# Patient Record
Sex: Male | Born: 1995 | Race: White | Hispanic: Yes | Marital: Single | Smoking: Never smoker
Health system: Southern US, Community
[De-identification: ages and names within clinical notes are randomized; demographics above are authoritative.]

---

## 2015-01-29 ENCOUNTER — Encounter (HOSPITAL_BASED_OUTPATIENT_CLINIC_OR_DEPARTMENT_OTHER): Payer: Self-pay

## 2015-01-29 ENCOUNTER — Emergency Department (HOSPITAL_BASED_OUTPATIENT_CLINIC_OR_DEPARTMENT_OTHER): Payer: Self-pay

## 2015-01-29 ENCOUNTER — Emergency Department (HOSPITAL_BASED_OUTPATIENT_CLINIC_OR_DEPARTMENT_OTHER)
Admission: EM | Admit: 2015-01-29 | Discharge: 2015-01-29 | Disposition: A | Discharge status: Home / Self Care | Payer: Self-pay | Attending: Emergency Medicine | Admitting: Emergency Medicine

## 2015-01-29 DIAGNOSIS — S61210A Laceration without foreign body of right index finger without damage to nail, initial encounter: Secondary | ICD-10-CM | POA: Insufficient documentation

## 2015-01-29 DIAGNOSIS — Y9389 Activity, other specified: Secondary | ICD-10-CM | POA: Insufficient documentation

## 2015-01-29 DIAGNOSIS — Y9289 Other specified places as the place of occurrence of the external cause: Secondary | ICD-10-CM | POA: Insufficient documentation

## 2015-01-29 DIAGNOSIS — W228XXA Striking against or struck by other objects, initial encounter: Secondary | ICD-10-CM | POA: Insufficient documentation

## 2015-01-29 DIAGNOSIS — S61219A Laceration without foreign body of unspecified finger without damage to nail, initial encounter: Secondary | ICD-10-CM

## 2015-01-29 DIAGNOSIS — Y998 Other external cause status: Secondary | ICD-10-CM | POA: Insufficient documentation

## 2015-01-29 MED ORDER — LIDOCAINE HCL (PF) 1 % IJ SOLN
INTRAMUSCULAR | Status: AC
  Administered 2015-01-29: 5 mL
  Filled 2015-01-29: qty 5

## 2015-01-29 MED ORDER — CEPHALEXIN 500 MG PO CAPS: 500.0000 mg | ORAL_CAPSULE | Freq: Three times a day (TID) | ORAL | Status: AC

## 2015-01-29 MED ORDER — BUPIVACAINE HCL 0.5 % IJ SOLN
INTRAMUSCULAR | Status: AC
  Administered 2015-01-29: 1 mL
  Filled 2015-01-29: qty 1

## 2015-01-29 NOTE — ED Notes (Signed)
Suture cart placed at bedside. 

## 2015-01-29 NOTE — ED Notes (Addendum)
Pt with limited english - reports laceration to right index finger on a tree. Bleeding controlled at this time.

## 2015-01-29 NOTE — ED Provider Notes (Signed)
CSN: [id number]     Arrival date & time 01/29/15  1404 History   First MD Initiated Contact with Patient 01/29/15 1417     Chief Complaint  Patient presents with  . Laceration     (Consider location/radiation/quality/duration/timing/severity/associated sxs/prior Treatment) Patient is a 19 y.o. male presenting with skin laceration. The history is provided by the patient. A language interpreter was used.  Laceration Location:  Finger Finger laceration location:  R index finger Length (cm):  2.5 Depth:  Through underlying tissue Quality: avulsion and jagged   Bleeding: controlled with pressure   Time since incident:  1 hour Injury mechanism: tree. Pain details:    Quality:  Throbbing   Severity:  Moderate   Timing:  Constant   Progression:  Worsening Foreign body present:  Unable to specify Relieved by:  None tried Worsened by:  Nothing tried Ineffective treatments:  None tried Tetanus status:  Up to date  Clayton Garcia is a 19 y.o. male who presents to the ED with a laceration to the right index finger. He states that he was cutting a tree and a piece of the tree was sticking out and he caught his finger on it.   History reviewed. No pertinent past medical history. History reviewed. No pertinent past surgical history. History reviewed. No pertinent family history. History  Substance Use Topics  . Smoking status: Never Smoker   . Smokeless tobacco: Not on file  . Alcohol Use: No    Review of Systems Negative except as stated in HPI   Allergies  Review of patient's allergies indicates not on file.  Home Medications   Prior to Admission medications   Medication Sig Start Date End Date Taking? Authorizing Provider  cephALEXin (KEFLEX) 500 MG capsule Take 1 capsule (500 mg total) by mouth 3 (three) times daily. 01/29/15   Clayton Mcgibbon Orlene Och, NP   BP 135/74 mmHg  Pulse 109  Temp(Src) 97.5 F (36.4 C)  Resp 18  Ht  (1.753 m)  Wt 130 lb (58.968 kg)  BMI 19.19  kg/m2  SpO2 100% Physical Exam  Constitutional: He is oriented to person, place, and time. He appears well-developed and well-nourished.  HENT:  Head: Normocephalic.  Eyes: Conjunctivae and EOM are normal.  Neck: Normal range of motion. Neck supple.  Cardiovascular: Normal rate.   Pulmonary/Chest: Effort normal.  Musculoskeletal: Normal range of motion.       Right hand: He exhibits tenderness and laceration. He exhibits normal range of motion and normal capillary refill. Normal sensation noted. Normal strength noted.       Hands: There is a deep, irregular flap laceration on the palmar aspect that extends to the dorsum of the finger. Good strength and sensation. No foreign body identified on x-ray or on physical exam. No tendon involvement identified.   Neurological: He is alert and oriented to person, place, and time. No cranial nerve deficit.  Skin: Skin is warm and dry.  Psychiatric: He has a normal mood and affect. His behavior is normal.  Nursing note and vitals reviewed.   ED Course  Procedures (including critical care time) LACERATION REPAIR Performed by: Clayton Garcia Authorized by: Clayton Garcia Consent: Verbal consent obtained. Risks and benefits: risks, benefits and alternatives were discussed Consent given by: patient Patient identity confirmed: provided demographic data Prepped and Draped in normal sterile fashion Wound explored  Laceration Location: right index finger  Flap laceration  Laceration Length: 2.5 cm  No Foreign Bodies seen or palpated  Digital Block  Local anesthetic: lidocaine 2% without epinephrine and Sensorcaine 0.5%  Anesthetic total: 5  ml  Irrigation method: syringe Amount of cleaning: standard  Skin closure: 4-0 prolene  Number of sutures: 5  Technique: interrupted   Patient tolerance: Patient tolerated the procedure well with no immediate complications.  Labs Review Labs Reviewed - No data to display  Imaging Review Dg  Finger Index Right  01/29/2015   CLINICAL DATA:  Laceration  EXAM: RIGHT INDEX FINGER 2+V  COMPARISON:  None.  FINDINGS: There is no evidence of fracture or dislocation. There is no evidence of arthropathy or other focal bone abnormality. Soft tissues are unremarkable.  IMPRESSION: Negative.   Electronically Signed   By: Marlan Palauharles  Clark M.D.   On: 01/29/2015 14:46   MDM  19 y.o. male with laceration to the right index finger while cutting a tree. Stable for d/c without neurovascular deficits. He will follow up in one week for suture removal or sooner for any problems.  Discussed with the patient and all questioned fully answered.  Final diagnoses:  Laceration of finger, initial encounter        [facility] Karina Nofsinger, NP 01/29/15 1617  Vanetta MuldersScott Zackowski, MD 01/30/15 [phone]

## 2015-01-29 NOTE — ED Notes (Signed)
MD at bedside. 

## 2015-01-29 NOTE — ED Notes (Signed)
Mom states td is utd.

## 2015-01-29 NOTE — Discharge Instructions (Signed)
Take tylenol or ibuprofen as needed for pain. °

## 2015-08-22 IMAGING — DX DG FINGER INDEX 2+V*R*
3 series · 3 of 3 positions shown · non-contrast
Comparison: None.

CLINICAL DATA: Laceration

EXAM:
RIGHT INDEX FINGER 2+V

[finger ap]
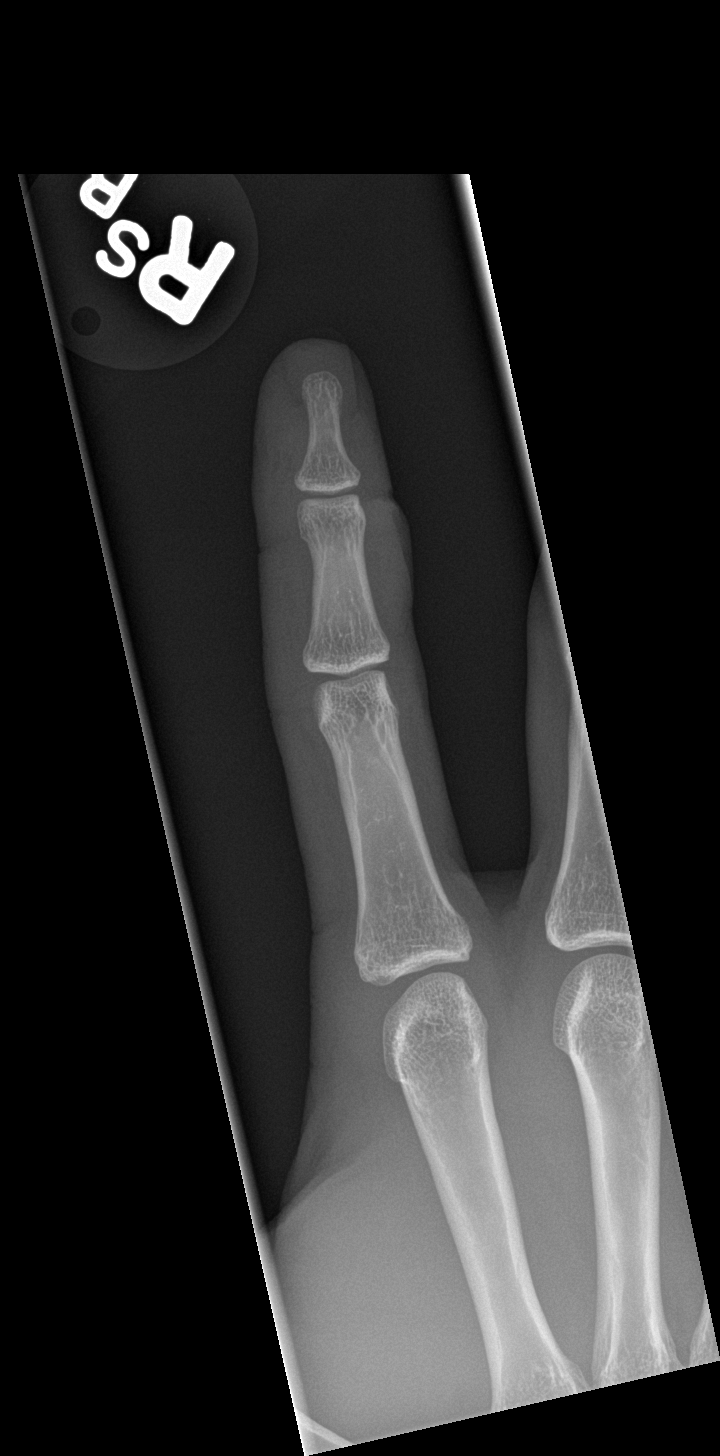

[finger obl]
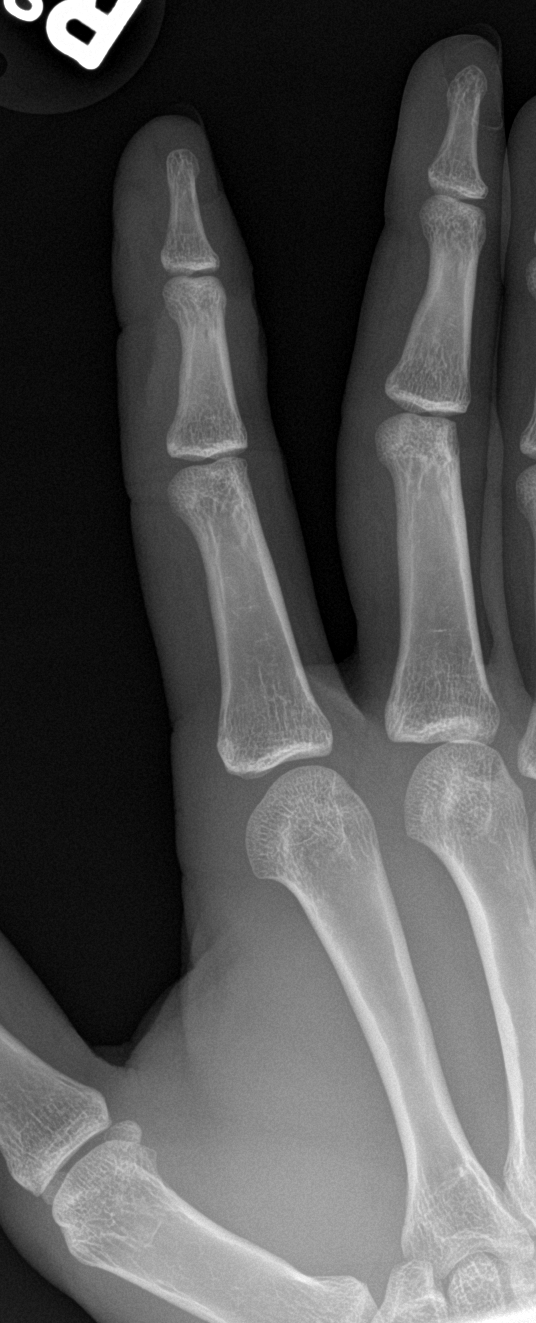

[finger lat]
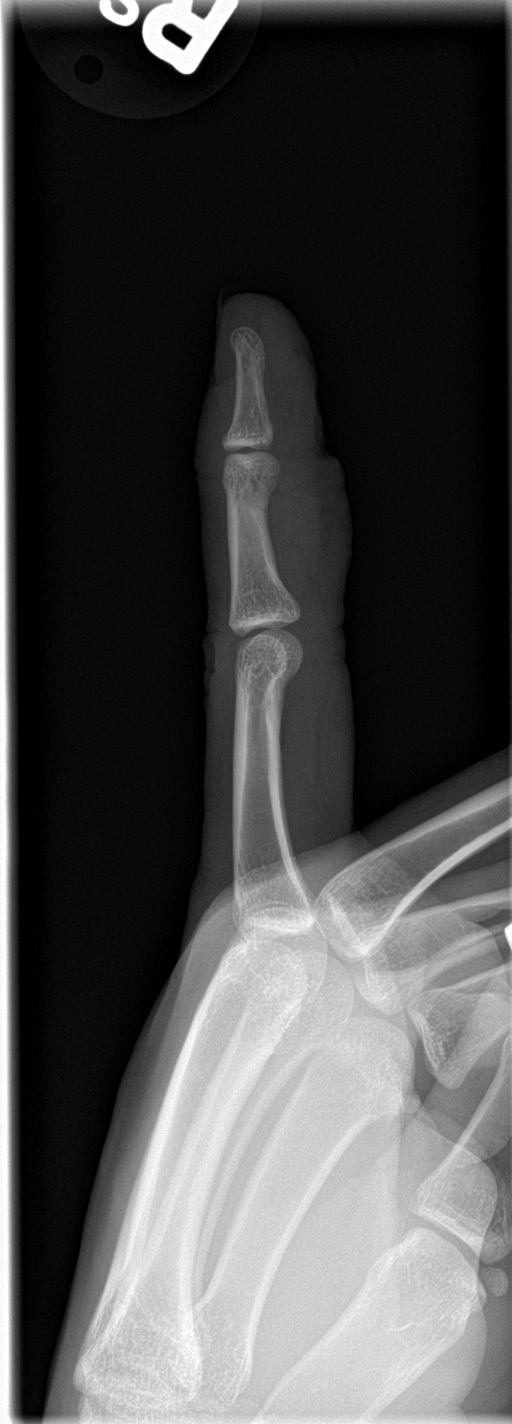

[3 of 3 positions shown; findings below may reference images not displayed]

FINDINGS: There is no evidence of fracture or dislocation. There is no
evidence of arthropathy or other focal bone abnormality. Soft
tissues are unremarkable.
IMPRESSION: Negative.

## 2024-03-26 ENCOUNTER — Other Ambulatory Visit: Payer: Self-pay

## 2024-03-26 ENCOUNTER — Emergency Department (HOSPITAL_BASED_OUTPATIENT_CLINIC_OR_DEPARTMENT_OTHER)

## 2024-03-26 ENCOUNTER — Emergency Department (HOSPITAL_BASED_OUTPATIENT_CLINIC_OR_DEPARTMENT_OTHER)
Admission: EM | Admit: 2024-03-26 | Discharge: 2024-03-26 | Disposition: A | Discharge status: Home / Self Care | Attending: Emergency Medicine | Admitting: Emergency Medicine

## 2024-03-26 ENCOUNTER — Ambulatory Visit: Admission: EM | Admit: 2024-03-26 | Discharge: 2024-03-26 | Disposition: A | Discharge status: Home / Self Care

## 2024-03-26 ENCOUNTER — Encounter (HOSPITAL_BASED_OUTPATIENT_CLINIC_OR_DEPARTMENT_OTHER): Payer: Self-pay | Admitting: Emergency Medicine

## 2024-03-26 DIAGNOSIS — R509 Fever, unspecified: Secondary | ICD-10-CM | POA: Diagnosis not present

## 2024-03-26 DIAGNOSIS — R197 Diarrhea, unspecified: Secondary | ICD-10-CM | POA: Insufficient documentation

## 2024-03-26 DIAGNOSIS — R112 Nausea with vomiting, unspecified: Secondary | ICD-10-CM | POA: Insufficient documentation

## 2024-03-26 DIAGNOSIS — R519 Headache, unspecified: Secondary | ICD-10-CM

## 2024-03-26 DIAGNOSIS — D72829 Elevated white blood cell count, unspecified: Secondary | ICD-10-CM | POA: Diagnosis not present

## 2024-03-26 DIAGNOSIS — R1031 Right lower quadrant pain: Secondary | ICD-10-CM

## 2024-03-26 LAB — CBC WITH DIFFERENTIAL/PLATELET
Abs Immature Granulocytes: 0.04 10*3/uL (ref 0.00–0.07)
Basophils Absolute: 0 10*3/uL (ref 0.0–0.1)
Basophils Relative: 0 %
Eosinophils Absolute: 0.1 10*3/uL (ref 0.0–0.5)
Eosinophils Relative: 0 %
HCT: 43.2 % (ref 39.0–52.0)
Hemoglobin: 14.8 g/dL (ref 13.0–17.0)
Immature Granulocytes: 0 %
Lymphocytes Relative: 15 %
Lymphs Abs: 1.8 10*3/uL (ref 0.7–4.0)
MCH: 29.8 pg (ref 26.0–34.0)
MCHC: 34.3 g/dL (ref 30.0–36.0)
MCV: 86.9 fL (ref 80.0–100.0)
Monocytes Absolute: 1.7 10*3/uL — ABNORMAL HIGH (ref 0.1–1.0)
Monocytes Relative: 14 %
Neutro Abs: 8.1 10*3/uL — ABNORMAL HIGH (ref 1.7–7.7)
Neutrophils Relative %: 71 %
Platelets: 251 10*3/uL (ref 150–400)
RBC: 4.97 MIL/uL (ref 4.22–5.81)
RDW: 13 % (ref 11.5–15.5)
WBC: 11.7 10*3/uL — ABNORMAL HIGH (ref 4.0–10.5)
nRBC: 0 % (ref 0.0–0.2)

## 2024-03-26 LAB — URINALYSIS, ROUTINE W REFLEX MICROSCOPIC
Bilirubin Urine: NEGATIVE
Glucose, UA: NEGATIVE mg/dL
Hgb urine dipstick: NEGATIVE
Ketones, ur: NEGATIVE mg/dL
Leukocytes,Ua: NEGATIVE
Nitrite: NEGATIVE
Protein, ur: NEGATIVE mg/dL
Specific Gravity, Urine: 1.015 (ref 1.005–1.030)
pH: 7 (ref 5.0–8.0)

## 2024-03-26 LAB — COMPREHENSIVE METABOLIC PANEL WITH GFR
ALT: 33 U/L (ref 0–44)
AST: 22 U/L (ref 15–41)
Albumin: 4.4 g/dL (ref 3.5–5.0)
Alkaline Phosphatase: 108 U/L (ref 38–126)
Anion gap: 14 (ref 5–15)
BUN: 8 mg/dL (ref 6–20)
CO2: 23 mmol/L (ref 22–32)
Calcium: 9.6 mg/dL (ref 8.9–10.3)
Chloride: 100 mmol/L (ref 98–111)
Creatinine, Ser: 1.06 mg/dL (ref 0.61–1.24)
GFR, Estimated: >60 mL/min (ref >60)
Glucose, Bld: 103 mg/dL — ABNORMAL HIGH (ref 70–99)
Potassium: 3.7 mmol/L (ref 3.5–5.1)
Sodium: 136 mmol/L (ref 135–145)
Total Bilirubin: 0.3 mg/dL (ref 0.0–1.2)
Total Protein: 7.9 g/dL (ref 6.5–8.1)

## 2024-03-26 LAB — LIPASE, BLOOD: Lipase: 27 U/L (ref 11–51)

## 2024-03-26 MED ORDER — ONDANSETRON 4 MG PO TBDP: 4.0000 mg | ORAL_TABLET | Freq: Three times a day (TID) | ORAL | 0 refills | Status: AC | PRN

## 2024-03-26 MED ORDER — MORPHINE SULFATE (PF) 4 MG/ML IV SOLN
4.0000 mg | Freq: Once | INTRAVENOUS | Status: AC
  Administered 2024-03-26: 4 mg via INTRAVENOUS
  Filled 2024-03-26: qty 1

## 2024-03-26 MED ORDER — IOHEXOL 300 MG/ML  SOLN
100.0000 mL | Freq: Once | INTRAMUSCULAR | Status: AC | PRN
  Administered 2024-03-26: 100 mL via INTRAVENOUS

## 2024-03-26 MED ORDER — ONDANSETRON HCL 4 MG/2ML IJ SOLN
4.0000 mg | Freq: Once | INTRAMUSCULAR | Status: AC
  Administered 2024-03-26: 4 mg via INTRAVENOUS
  Filled 2024-03-26: qty 2

## 2024-03-26 NOTE — Discharge Instructions (Addendum)
 Today you were seen for right lower quadrant abdominal pain with nausea, vomiting, and diarrhea.  Please pick up your Zofran  and take as needed for nausea and vomiting.  You may also take over-the-counter Imodium sparingly as needed for diarrhea.  If you have uncontrollable vomiting, fever, or your pain worsens please return to the ED for further evaluation and workup.  Thank you for letting us  treat you today. After reviewing your labs and imaging, I feel you are safe to go home. Please follow up with your PCP in the next several days and provide them with your records from this visit. Return to the Emergency Room if pain becomes severe or symptoms worsen.

## 2024-03-26 NOTE — ED Triage Notes (Signed)
 Pt referred by UC d/t RLQ pain x 3d, worse with palpation; nausea and diarrhea on 4/21; also c/o HA

## 2024-03-26 NOTE — Discharge Instructions (Signed)
 At this time I am concerned that your abdominal pain may be related to your appendix.  You are level of pain as well as mildly elevated temperature and lack of improvement with over-the-counter medications is suspicious for potential acute abdomen which will need to be evaluated further in the emergency room with appropriate imaging and blood work.  Please go directly to the closest emergency room after you are discharged from urgent care.  If at any point you feel like you cannot make it to the emergency room please pull over and call 911 for further assistance.  En este momento, me preocupa que su dolor abdominal pueda estar relacionado con su apndice. El nivel de Engineer, mining, as como la fiebre ligeramente elevada y la falta de mejora con medicamentos de venta libre, Engineer, water sospechar un posible abdomen agudo, que deber evaluarse en urgencias con las imgenes y los anlisis de sangre correspondientes. Por favor, acuda directamente a la sala de urgencias ms cercana despus de recibir el alta. Si en algn momento siente que no puede llegar a urgencias, detngase y llame al 911 para obtener ayuda.

## 2024-03-26 NOTE — ED Provider Notes (Signed)
 [facility]    CSN: [id number] Arrival date & time: 03/26/24  1134      History   Chief Complaint Chief Complaint  Patient presents with   Abdominal Pain    HPI Clayton Garcia is a 28 y.o. male.   HPI  Spanish translator : Jeniffer # [id number]  He reports right sided abdominal pain for the past 3 days (he indicates area along RLQ as source of pain) He reports having chills but denies fever He states he has had nausea but no vomiting  He reports he is not able to eat or drink due to nausea  He reports the headache is the same pain level as the stomach pain   Interventions: Tylenol and Ibuprofen  Last dose of Ibuprofen was 5 AM. Last dose of Tylenol was yesterday.   History reviewed. No pertinent past medical history.  There are no active problems to display for this patient.   History reviewed. No pertinent surgical history.     Home Medications    Prior to Admission medications   Medication Sig Start Date End Date Taking? Authorizing Provider  cephALEXin  (KEFLEX ) 500 MG capsule Take 1 capsule (500 mg total) by mouth 3 (three) times daily. 01/29/15   Hardie Leyland, NP    Family History History reviewed. No pertinent family history.  Social History Social History   Tobacco Use   Smoking status: Never   Smokeless tobacco: Never  Vaping Use   Vaping status: Never Used  Substance Use Topics   Alcohol use: No   Drug use: Never     Allergies   Patient has no known allergies.   Review of Systems Review of Systems  Constitutional:  Positive for chills and fatigue. Negative for fever.  Gastrointestinal:  Positive for abdominal pain and nausea. Negative for blood in stool, constipation, diarrhea and vomiting.  Neurological:  Positive for headaches.     Physical Exam Triage Vital Signs ED Triage Vitals  Encounter Vitals Group     BP 03/26/24 1200 116/74     Systolic BP Percentile --      Diastolic BP Percentile --      Pulse Rate  03/26/24 1200 79     Resp 03/26/24 1200 16     Temp 03/26/24 1200 99 F (37.2 C)     Temp Source 03/26/24 1200 Oral     SpO2 03/26/24 1200 98 %     Weight 03/26/24 1215 165 lb (74.8 kg)     Height 03/26/24 1214 5\' 9"  (1.753 m)     Head Circumference --      Peak Flow --      Pain Score 03/26/24 1213 9     Pain Loc --      Pain Education --      Exclude from Growth Chart --    No data found.  Updated Vital Signs BP 116/74 (BP Location: Right Arm)   Pulse 79   Temp 99 F (37.2 C) (Oral)   Resp 16   Ht 5\' 9"  (1.753 m)   Wt 165 lb (74.8 kg)   SpO2 98%   BMI 24.37 kg/m   Visual Acuity Right Eye Distance:   Left Eye Distance:   Bilateral Distance:    Right Eye Near:   Left Eye Near:    Bilateral Near:     Physical Exam Vitals reviewed.  Constitutional:      General: He is awake. He is not in acute distress.  Appearance: Normal appearance. He is well-developed and well-groomed. He is not ill-appearing or toxic-appearing.     Comments: Patient is 28 yo male who appears stated age, seated comfortably in exam chair, no apparent acute distress   HENT:     Head: Normocephalic and atraumatic.  Cardiovascular:     Rate and Rhythm: Normal rate and regular rhythm.     Heart sounds: Normal heart sounds. No murmur heard.    No friction rub.  Pulmonary:     Effort: Pulmonary effort is normal.     Breath sounds: Normal breath sounds. No decreased air movement. No decreased breath sounds, wheezing, rhonchi or rales.  Abdominal:     General: Abdomen is flat. Bowel sounds are normal.     Palpations: Abdomen is soft.     Tenderness: There is abdominal tenderness in the right lower quadrant. There is guarding. There is no right CVA tenderness or left CVA tenderness. Positive signs include McBurney's sign. Negative signs include psoas sign and obturator sign.     Hernia: There is no hernia in the umbilical area or ventral area.  Neurological:     General: No focal deficit present.      Mental Status: He is alert and oriented to person, place, and time.     GCS: GCS eye subscore is 4. GCS verbal subscore is 5. GCS motor subscore is 6.     Cranial Nerves: No cranial nerve deficit, dysarthria or facial asymmetry.  Psychiatric:        Attention and Perception: Attention and perception normal.        Mood and Affect: Mood and affect normal.        Speech: Speech normal.        Behavior: Behavior is cooperative.      UC Treatments / Results  Labs (all labs ordered are listed, but only abnormal results are displayed) Labs Reviewed - No data to display  EKG   Radiology No results found.  Procedures Procedures (including critical care time)  Medications Ordered in UC Medications - No data to display  Initial Impression / Assessment and Plan / UC Course  I have reviewed the triage vital signs and the nursing notes.  Pertinent labs & imaging results that were available during my care of the patient were reviewed by me and considered in my medical decision making (see chart for details).      Final Clinical Impressions(s) / UC Diagnoses   Final diagnoses:  RLQ abdominal pain  Acute intractable headache, unspecified headache type   Patient presents today with concerns for right lower quadrant abdominal pain that has been going on for the past 3 days.  He reports that pain is a 9 out of 10 and is worse with palpation.  He reports chills as well but denies fevers.  He also reports nausea and states that he cannot tolerate p.o. intake due to increased nausea when trying to eat or drink.  Physical exam is notable for significant tenderness in the right lower quadrant.  Reviewed with patient that I cannot rule out an acute abdomen especially given mildly elevated temperature here in clinic as well as notable discomfort with palpation.  Recommend prompt evaluation in the emergency room with appropriate imaging and blood work to rule out acute abdomen particularly  appendicitis.  Patient voiced agreement understanding with recommendations and agrees to go via private vehicle to the emergency room.  He declines EMS transportation.    Discharge Instructions  At this time I am concerned that your abdominal pain may be related to your appendix.  You are level of pain as well as mildly elevated temperature and lack of improvement with over-the-counter medications is suspicious for potential acute abdomen which will need to be evaluated further in the emergency room with appropriate imaging and blood work.  Please go directly to the closest emergency room after you are discharged from urgent care.  If at any point you feel like you cannot make it to the emergency room please pull over and call 911 for further assistance.  En este momento, me preocupa que su dolor abdominal pueda estar relacionado con su apndice. El nivel de Engineer, mining, as como la fiebre ligeramente elevada y la falta de mejora con medicamentos de venta libre, Engineer, water sospechar un posible abdomen agudo, que deber evaluarse en urgencias con las imgenes y los anlisis de sangre correspondientes. Por favor, acuda directamente a la sala de urgencias ms cercana despus de recibir el alta. Si en algn momento siente que no puede llegar a urgencias, detngase y llame al 911 para obtener ayuda.     ED Prescriptions   None    PDMP not reviewed this encounter.   Lajoya Dombek, Pearla Bottom, PA-C 03/26/24 1340

## 2024-03-26 NOTE — ED Provider Notes (Signed)
 [facility] Provider Note   CSN: [id number] Arrival date & time: 03/26/24  1542     History  Chief Complaint  Patient presents with   Abdominal Pain    Clayton Garcia is a 28 y.o. male presents today for right lower quadrant pain x 3 days.  Patient also endorses nausea, vomiting, diarrhea, and fever.  Patient denies hematemesis, blood in stool, shortness of breath, chest pain, urinary symptoms, or bodyaches.  Patient also endorses a headache without photophobia, phonophobia, vision changes, or tinnitus.   Abdominal Pain Associated symptoms: diarrhea, nausea and vomiting        Home Medications Prior to Admission medications   Medication Sig Start Date End Date Taking? Authorizing Provider  ondansetron  (ZOFRAN -ODT) 4 MG disintegrating tablet Take 1 tablet (4 mg total) by mouth every 8 (eight) hours as needed for nausea or vomiting. 03/26/24  Yes Corliss Coggeshall N, PA-C  cephALEXin  (KEFLEX ) 500 MG capsule Take 1 capsule (500 mg total) by mouth 3 (three) times daily. 01/29/15   Hardie Leyland, NP      Allergies    Patient has no known allergies.    Review of Systems   Review of Systems  Gastrointestinal:  Positive for abdominal pain, diarrhea, nausea and vomiting.  Neurological:  Positive for headaches.    Physical Exam Updated Vital Signs BP 121/77   Pulse 98   Temp 98.9 F (37.2 C)   Resp 18   Ht 5\' 9"  (1.753 m)   Wt 74.8 kg   SpO2 99%   BMI 24.35 kg/m  Physical Exam Vitals and nursing note reviewed.  Constitutional:      General: He is not in acute distress.    Appearance: He is well-developed. He is ill-appearing. He is not toxic-appearing.  HENT:     Head: Normocephalic and atraumatic.  Eyes:     Conjunctiva/sclera: Conjunctivae normal.  Cardiovascular:     Rate and Rhythm: Normal rate and regular rhythm.     Heart sounds: No murmur heard. Pulmonary:     Effort: Pulmonary effort is normal. No respiratory  distress.     Breath sounds: Normal breath sounds.  Abdominal:     General: There is no distension.     Palpations: Abdomen is soft.     Tenderness: There is abdominal tenderness in the right lower quadrant. Positive signs include McBurney's sign. Negative signs include Murphy's sign and Rovsing's sign.  Musculoskeletal:        General: No swelling.     Cervical back: Neck supple.  Skin:    General: Skin is warm and dry.     Capillary Refill: Capillary refill takes less than 2 seconds.  Neurological:     General: No focal deficit present.     Mental Status: He is alert.  Psychiatric:        Mood and Affect: Mood normal.     ED Results / Procedures / Treatments   Labs (all labs ordered are listed, but only abnormal results are displayed) Labs Reviewed  COMPREHENSIVE METABOLIC PANEL WITH GFR - Abnormal; Notable for the following components:      Result Value   Glucose, Bld 103 (*)    All other components within normal limits  CBC WITH DIFFERENTIAL/PLATELET - Abnormal; Notable for the following components:   WBC 11.7 (*)    Neutro Abs 8.1 (*)    Monocytes Absolute 1.7 (*)    All other components within normal limits  LIPASE, BLOOD  URINALYSIS, ROUTINE W REFLEX MICROSCOPIC    EKG None  Radiology CT ABDOMEN PELVIS W CONTRAST Result Date: 03/26/2024 CLINICAL DATA:  Right lower quadrant abdominal pain. EXAM: CT ABDOMEN AND PELVIS WITH CONTRAST TECHNIQUE: Multidetector CT imaging of the abdomen and pelvis was performed using the standard protocol following bolus administration of intravenous contrast. RADIATION DOSE REDUCTION: This exam was performed according to the departmental dose-optimization program which includes automated exposure control, adjustment of the mA and/or kV according to patient size and/or use of iterative reconstruction technique. CONTRAST:  OMNIPAQUE  IOHEXOL  300 MG/ML  SOLN COMPARISON:  None Available. FINDINGS: Evaluation of this exam is limited due to  respiratory motion. Lower chest: The visualized lung bases are clear. No intra-abdominal free air or free fluid. Hepatobiliary: No focal liver abnormality is seen. No gallstones, gallbladder wall thickening, or biliary dilatation. Pancreas: Unremarkable. No pancreatic ductal dilatation or surrounding inflammatory changes. Spleen: Normal in size without focal abnormality. Adrenals/Urinary Tract: The adrenal glands unremarkable. There is no hydronephrosis on either side. The visualized ureters and urinary bladder appear unremarkable. Stomach/Bowel: There is no bowel obstruction or active inflammation. The appendix is normal. Vascular/Lymphatic: The abdominal aorta and IVC are unremarkable. No portal gas. There is no adenopathy. Reproductive: The prostate and seminal vesicles are grossly unremarkable. No pelvic mass. Other: None Musculoskeletal: No acute or significant osseous findings. IMPRESSION: No acute intra-abdominal or pelvic pathology. Normal appendix. Electronically Signed   By: Angus Bark M.D.   On: 03/26/2024 18:24    Procedures Procedures    Medications Ordered in ED Medications  ondansetron  (ZOFRAN ) injection 4 mg (4 mg Intravenous Given 03/26/24 1753)  morphine  (PF) 4 MG/ML injection 4 mg (4 mg Intravenous Given 03/26/24 1754)  iohexol  (OMNIPAQUE ) 300 MG/ML solution 100 mL (100 mLs Intravenous Contrast Given 03/26/24 1805)    ED Course/ Medical Decision Making/ A&P                                 Medical Decision Making Amount and/or Complexity of Data Reviewed Labs: ordered. Radiology: ordered.  Risk Prescription drug management.   This patient presents to the ED for concern of abdominal pain with nausea, vomiting, diarrhea differential diagnosis includes appendicitis, choledocholithiasis, viral GI illness, diverticulitis, SBO, acute cholecystitis    Additional history obtained:  Additional history obtained from family   Lab Tests:  I Ordered, and personally  interpreted labs.  The pertinent results include: Leukocytosis at 11.7   Imaging Studies ordered:  I ordered imaging studies including CT abdomen pelvis with contrast I independently visualized and interpreted imaging which showed no acute intra-abdominal or pelvic pathology.  Normal appendix. I agree with the radiologist interpretation   Medicines ordered and prescription drug management:  I ordered medication including morphine  and Zofran  for pain and nausea Reevaluation of the patient after these medicines showed that the patient improved I have reviewed the patients home medicines and have made adjustments as needed   Consider for admission or further workup however patient's vital signs, physical exam, labs, and imaging are reassuring.  Patient's symptoms likely due to viral GI illness.  Patient able to tolerate p.o. intake prior to discharge.  Patient discharged on short course of Zofran  as needed for nausea and vomiting.  Patient also advised to take over-the-counter Imodium sparingly as needed for diarrhea.  Patient should follow-up with his primary care if his symptoms persist for further evaluation and treatment.  Final Clinical Impression(s) / ED Diagnoses Final diagnoses:  Nausea vomiting and diarrhea  Right lower quadrant abdominal pain    Rx / DC Orders ED Discharge Orders          Ordered    ondansetron  (ZOFRAN -ODT) 4 MG disintegrating tablet  Every 8 hours PRN        03/26/24 1830              Carie Charity, PA-C 03/26/24 1902    Tegeler, Marine Sia, MD 03/26/24 2049

## 2024-03-26 NOTE — ED Triage Notes (Addendum)
 [facility] # [id number]  Pt presents with complaints with right lower abdominal pain x 3 days. Pt states his pain worsens when he touches the area. Pt currently rates his overall abdominal pain a 9/10. OTC Tylenol + Ibuprofen taken with little relief. Nausea/Diarrhea reported on Monday 4/21.  Abdominal pain is accompanied by a headache as well. More on the left-side per pt description.

## 2024-03-26 NOTE — ED Notes (Signed)
 Pt given Gatorlyte and Gingerale for PO challenge.

## 2024-03-26 NOTE — ED Notes (Signed)
 Patient is being discharged from the Urgent Care and sent to the Emergency Department via private vehicle . Per Cleveland Dales PA, patient is in need of higher level of care due to right lower abdominal pain x 3 days. Patient is aware and verbalizes understanding of plan of care.   Vitals:   03/26/24 1200  BP: 116/74  Pulse: 79  Resp: 16  Temp: 99 F (37.2 C)  SpO2: 98%
